# Patient Record
Sex: Female | Born: 1990 | Race: Black or African American | Hispanic: No | Marital: Married | Smoking: Current some day smoker
Health system: Southern US, Community
[De-identification: ages and names within clinical notes are randomized; demographics above are authoritative.]

## PROBLEM LIST (undated history)

## (undated) DIAGNOSIS — J3501 Chronic tonsillitis: Secondary | ICD-10-CM

## (undated) HISTORY — PX: NO PAST SURGERIES: SHX2092

---

## 2016-02-27 ENCOUNTER — Encounter: Payer: Self-pay | Admitting: Emergency Medicine

## 2016-02-27 ENCOUNTER — Emergency Department
Admission: EM | Admit: 2016-02-27 | Discharge: 2016-02-27 | Disposition: A | Discharge status: Home / Self Care | Payer: Self-pay | Attending: Student | Admitting: Student

## 2016-02-27 DIAGNOSIS — N390 Urinary tract infection, site not specified: Secondary | ICD-10-CM | POA: Insufficient documentation

## 2016-02-27 LAB — URINALYSIS COMPLETE WITH MICROSCOPIC (ARMC ONLY)
BILIRUBIN URINE: NEGATIVE
Glucose, UA: NEGATIVE mg/dL
Ketones, ur: NEGATIVE mg/dL
NITRITE: NEGATIVE
PH: 7 (ref 5.0–8.0)
Protein, ur: NEGATIVE mg/dL
SPECIFIC GRAVITY, URINE: 1.008 (ref 1.005–1.030)

## 2016-02-27 LAB — COMPREHENSIVE METABOLIC PANEL
ALBUMIN: 3.8 g/dL (ref 3.5–5.0)
ALK PHOS: 66 U/L (ref 38–126)
ALT: 17 U/L (ref 14–54)
ANION GAP: 7 (ref 5–15)
AST: 23 U/L (ref 15–41)
BILIRUBIN TOTAL: 0.6 mg/dL (ref 0.3–1.2)
BUN: 11 mg/dL (ref 6–20)
CHLORIDE: 108 mmol/L (ref 101–111)
CO2: 24 mmol/L (ref 22–32)
Calcium: 9.1 mg/dL (ref 8.9–10.3)
Creatinine, Ser: 0.83 mg/dL (ref 0.44–1.00)
GFR calc Af Amer: >60 mL/min (ref >60)
GFR calc non Af Amer: >60 mL/min (ref >60)
Glucose, Bld: 90 mg/dL (ref 65–99)
POTASSIUM: 4 mmol/L (ref 3.5–5.1)
SODIUM: 139 mmol/L (ref 135–145)
TOTAL PROTEIN: 7.8 g/dL (ref 6.5–8.1)

## 2016-02-27 LAB — CBC
HEMATOCRIT: 35.1 % (ref 35.0–47.0)
Hemoglobin: 11.8 g/dL — ABNORMAL LOW (ref 12.0–16.0)
MCH: 30.7 pg (ref 26.0–34.0)
MCHC: 33.5 g/dL (ref 32.0–36.0)
MCV: 91.5 fL (ref 80.0–100.0)
PLATELETS: 315 10*3/uL (ref 150–440)
RBC: 3.84 MIL/uL (ref 3.80–5.20)
RDW: 13.4 % (ref 11.5–14.5)
WBC: 8.3 10*3/uL (ref 3.6–11.0)

## 2016-02-27 LAB — LIPASE, BLOOD: Lipase: 15 U/L (ref 11–51)

## 2016-02-27 LAB — POCT PREGNANCY, URINE: Preg Test, Ur: NEGATIVE

## 2016-02-27 MED ORDER — SULFAMETHOXAZOLE-TRIMETHOPRIM 800-160 MG PO TABS
1.0000 | ORAL_TABLET | Freq: Once | ORAL | Status: AC
  Administered 2016-02-27: 1 via ORAL
  Filled 2016-02-27: qty 1

## 2016-02-27 MED ORDER — SULFAMETHOXAZOLE-TRIMETHOPRIM 800-160 MG PO TABS: 1.0000 | ORAL_TABLET | Freq: Two times a day (BID) | ORAL | Status: DC

## 2016-02-27 NOTE — Discharge Instructions (Signed)
Urinary Tract Infection Urinary tract infections (UTIs) can develop anywhere along your urinary tract. Your urinary tract is your body's drainage system for removing wastes and extra water. Your urinary tract includes two kidneys, two ureters, a bladder, and a urethra. Your kidneys are a pair of bean-shaped organs. Each kidney is about the size of your fist. They are located below your ribs, one on each side of your spine. CAUSES Infections are caused by microbes, which are microscopic organisms, including fungi, viruses, and bacteria. These organisms are so small that they can only be seen through a microscope. Bacteria are the microbes that most commonly cause UTIs. SYMPTOMS  Symptoms of UTIs may vary by age and gender of the patient and by the location of the infection. Symptoms in young women typically include a frequent and intense urge to urinate and a painful, burning feeling in the bladder or urethra during urination. Older women and men are more likely to be tired, shaky, and weak and have muscle aches and abdominal pain. A fever may mean the infection is in your kidneys. Other symptoms of a kidney infection include pain in your back or sides below the ribs, nausea, and vomiting. DIAGNOSIS To diagnose a UTI, your caregiver will ask you about your symptoms. Your caregiver will also ask you to provide a urine sample. The urine sample will be tested for bacteria and white blood cells. White blood cells are made by your body to help fight infection. TREATMENT  Typically, UTIs can be treated with medication. Because most UTIs are caused by a bacterial infection, they usually can be treated with the use of antibiotics. The choice of antibiotic and length of treatment depend on your symptoms and the type of bacteria causing your infection. HOME CARE INSTRUCTIONS  If you were prescribed antibiotics, take them exactly as your caregiver instructs you. Finish the medication even if you feel better after  you have only taken some of the medication.  Drink enough water and fluids to keep your urine clear or pale yellow.  Avoid caffeine, tea, and carbonated beverages. They tend to irritate your bladder.  Empty your bladder often. Avoid holding urine for long periods of time.  Empty your bladder before and after sexual intercourse.  After a bowel movement, women should cleanse from front to back. Use each tissue only once. SEEK MEDICAL CARE IF:   You have back pain.  You develop a fever.  Your symptoms do not begin to resolve within 3 days. SEEK IMMEDIATE MEDICAL CARE IF:   You have severe back pain or lower abdominal pain.  You develop chills.  You have nausea or vomiting.  You have continued burning or discomfort with urination. MAKE SURE YOU:   Understand these instructions.  Will watch your condition.  Will get help right away if you are not doing well or get worse.   This information is not intended to replace advice given to you by your health care provider. Make sure you discuss any questions you have with your health care provider.   Document Released: 06/23/2005 Document Revised: 06/04/2015 Document Reviewed: 10/22/2011 Elsevier Interactive Patient Education [employer]2016 [employer]vier [employer].   Take the prescription med as directed. Increase fluid intake and empty your bladder on schedule. [facility] needed or return to the ED as needed.

## 2016-02-27 NOTE — ED Provider Notes (Signed)
[facility] Emergency Department Provider Note ____________________________________________  Time seen: 1350  I have reviewed the triage vital signs and the nursing notes.  HISTORY  Chief Complaint  Back Pain  HPI Disney Ruggiero is a 25 y.o. female sensitivity ED for evaluation of pain to her left flank and lower pelvis for the last 2 days. She denies any injury, trauma, fall, or accident. She does have a history of some left sciatica was concerned that that was plan again. She does not note any referred pain beyond the waist or hip. She has noticed some dark urine over the last few days as well she denies any outright fevers or sweats, but has noticed some slight chills intermittently.She rates her overall discomfort at a 7/10 in triage  History reviewed. No pertinent past medical history.  There are no active problems to display for this patient.  History reviewed. No pertinent past surgical history.  Current Outpatient Rx  Name  Route  Sig  Dispense  Refill  . sulfamethoxazole-trimethoprim (BACTRIM DS,SEPTRA DS) 800-160 MG tablet   Oral   Take 1 tablet by mouth 2 (two) times daily.   14 tablet   0     Allergies Penicillins  History reviewed. No pertinent family history.  Social History Social History  Substance Use Topics  . Smoking status: Never Smoker   . Smokeless tobacco: None  . Alcohol Use: Yes    Review of Systems  Constitutional: Negative for fever. Reports chills.  Cardiovascular: Negative for chest pain. Respiratory: Negative for shortness of breath. Gastrointestinal: Negative for abdominal pain, vomiting and diarrhea. Reports left flank pain  Genitourinary: Negative for dysuria. Describes dark urine Musculoskeletal: Positive for back pain. Neurological: Negative for headaches, focal weakness or numbness. ____________________________________________  PHYSICAL EXAM:  VITAL SIGNS: ED Triage Vitals  Enc Vitals Group     BP  02/27/16 1108 129/68 mmHg     Pulse Rate 02/27/16 1108 85     Resp 02/27/16 1108 20     Temp 02/27/16 1108 97.6 F (36.4 C)     Temp Source 02/27/16 1108 Oral     SpO2 02/27/16 1108 100 %     Weight 02/27/16 1108 232 lb (105.235 kg)     Height 02/27/16 1108  (1.727 m)     Head Cir --      Peak Flow --      Pain Score 02/27/16 1109 7     Pain Loc --      Pain Edu? --      Excl. in GC? --    Constitutional: Alert and oriented. Well appearing and in no distress. Head: Normocephalic and atraumatic. Cardiovascular: Normal rate, regular rhythm.  Respiratory: Normal respiratory effort. No wheezes/rales/rhonchi. Gastrointestinal: Soft and nontender. No distention, rebound, guarding, or organomegaly. Mild left CVA tenderness.  Musculoskeletal: Nontender with normal range of motion in all extremities.  Neurologic:  Normal gait without ataxia. Normal speech and language. No gross focal neurologic deficits are appreciated. __________________________________________    LABS (pertinent positives/negatives) Labs Reviewed  CBC - Abnormal; Notable for the following:    Hemoglobin 11.8 (*)    All other components within normal limits  URINALYSIS COMPLETEWITH MICROSCOPIC ([facility] ONLY) - Abnormal; Notable for the following:    Color, Urine YELLOW (*)    APPearance HAZY (*)    Hgb urine dipstick 1+ (*)    Leukocytes, UA 2+ (*)    Bacteria, UA RARE (*)    Squamous Epithelial / LPF 6-30 (*)  All other components within normal limits  URINE CULTURE  LIPASE, BLOOD  COMPREHENSIVE METABOLIC PANEL  POC URINE PREG, ED  POCT PREGNANCY, URINE  ____________________________________________  PROCEDURES  Bactrim DS i PO ____________________________________________  INITIAL IMPRESSION / ASSESSMENT AND PLAN / ED COURSE  Presents to the ED with a urinary tract infection confirmed on urinalysis. She'll be discharged with a prescription for Bactrim to dose daily as directed. She should follow-up  with a primary care provider for ongoing symptom management. Return precautions are reviewed. A work note is provided suggesting limited and equal sit & stand during her shift tomorrow. ____________________________________________  FINAL CLINICAL IMPRESSION(S) / ED DIAGNOSES  Final diagnoses:  UTI (lower urinary tract infection)     Lissa HoardJenise V Bacon Donte Lenzo, PA-C 02/27/16 1533  Gayla DossEryka A Gayle, MD 02/27/16 1655

## 2016-02-27 NOTE — ED Notes (Signed)
Pt verbalized understanding of discharge instructions. NAD at this time. 

## 2016-02-27 NOTE — ED Notes (Signed)
Pt to ed with c/o lower back pain that radiates into left buttock area, hx of sciatica.  Pt also reports left lower abd pain that radiates to left flank area x 2 days.  Pt denies difficulty and denies pain with urination.  Pt denies vaginal d/c.  Denies v/d.

## 2016-02-27 NOTE — ED Notes (Signed)
States she developed lower back pain which radiates into left abd and left leg deneis any injury  Ambulates slowly d/t pain

## 2016-02-29 LAB — URINE CULTURE
Culture: >=100000 — AB
SPECIAL REQUESTS: NORMAL

## 2016-09-03 ENCOUNTER — Encounter: Payer: Self-pay | Admitting: Emergency Medicine

## 2016-09-03 ENCOUNTER — Emergency Department
Admission: EM | Admit: 2016-09-03 | Discharge: 2016-09-03 | Disposition: A | Discharge status: Home / Self Care | Payer: 59 | Attending: Emergency Medicine | Admitting: Emergency Medicine

## 2016-09-03 ENCOUNTER — Emergency Department: Payer: 59

## 2016-09-03 DIAGNOSIS — Z3A01 Less than 8 weeks gestation of pregnancy: Secondary | ICD-10-CM | POA: Insufficient documentation

## 2016-09-03 DIAGNOSIS — O4690 Antepartum hemorrhage, unspecified, unspecified trimester: Secondary | ICD-10-CM

## 2016-09-03 DIAGNOSIS — O3680X0 Pregnancy with inconclusive fetal viability, not applicable or unspecified: Secondary | ICD-10-CM

## 2016-09-03 DIAGNOSIS — O209 Hemorrhage in early pregnancy, unspecified: Secondary | ICD-10-CM | POA: Diagnosis not present

## 2016-09-03 LAB — CBC
HEMATOCRIT: 35.6 % (ref 35.0–47.0)
Hemoglobin: 12.2 g/dL (ref 12.0–16.0)
MCH: 32 pg (ref 26.0–34.0)
MCHC: 34.3 g/dL (ref 32.0–36.0)
MCV: 93.2 fL (ref 80.0–100.0)
PLATELETS: 240 10*3/uL (ref 150–440)
RBC: 3.82 MIL/uL (ref 3.80–5.20)
RDW: 13.9 % (ref 11.5–14.5)
WBC: 5.6 10*3/uL (ref 3.6–11.0)

## 2016-09-03 LAB — ABO/RH: ABO/RH(D): A NEG

## 2016-09-03 LAB — ANTIBODY SCREEN: ANTIBODY SCREEN: NEGATIVE

## 2016-09-03 LAB — HCG, QUANTITATIVE, PREGNANCY: hCG, Beta Chain, Quant, S: 129 m[IU]/mL — ABNORMAL HIGH (ref <5)

## 2016-09-03 MED ORDER — RHO D IMMUNE GLOBULIN 1500 UNIT/2ML IJ SOSY
300.0000 ug | PREFILLED_SYRINGE | Freq: Once | INTRAMUSCULAR | Status: AC
  Administered 2016-09-03: 300 ug via INTRAMUSCULAR
  Filled 2016-09-03: qty 2

## 2016-09-03 NOTE — ED Notes (Signed)
Pt comfortable and smiling. Denies pain and states it's "just discomfort.

## 2016-09-03 NOTE — ED Provider Notes (Signed)
[facility] Emergency Department Provider Note   ____________________________________________   First MD Initiated Contact with Patient 09/03/16 [phone]     (approximate)  I have reviewed the triage vital signs and the nursing notes.   HISTORY  Chief Complaint Vaginal Bleeding    HPI Sonya Cole is a 25 y.o. female 2 previous normal vaginal deliveries. Patient reports her last menstrual cycle was somewhat unclear, but believes it started around the end of October. She estimates she is about 6-[redacted] weeks pregnant, and had intercourse last night followed by some slight red vaginal bleeding, this morning having only needed one pad and now a slight purplish color.  Denies being in any significant discomfort or pain. Reports she's had slight vaginal bleeding early in both of her previous pregnancies. She has had to have a program in the past for A- blood type.  No fevers or chills. No other vaginal discharge. Denies any pelvic pain or cramps. She has obstetrician at [facility].    History reviewed. No pertinent past medical history.  There are no active problems to display for this patient.   History reviewed. No pertinent surgical history.  Prior to Admission medications   Medication Sig Start Date End Date Taking? Authorizing Provider  sulfamethoxazole-trimethoprim (BACTRIM DS,SEPTRA DS) 800-160 MG tablet Take 1 tablet by mouth 2 (two) times daily. 02/27/16   Lissa Hoard, PA-C  Patient currently not taking any medications, she is taking a prenatal vitamin only  Allergies Penicillins  No family history on file.  Social History Social History  Substance Use Topics  . Smoking status: Never Smoker  . Smokeless tobacco: Never Used  . Alcohol use Yes    Review of Systems Constitutional: No fever/chills Eyes: No visual changes. ENT: No sore throat. Cardiovascular: Denies chest pain. Respiratory: Denies shortness of  breath. Gastrointestinal: No abdominal pain.  No nausea, no vomiting.  No diarrhea.  No constipation. Genitourinary: Negative for dysuria. Musculoskeletal: Negative for back pain. Skin: Negative for rash. Neurological: Negative for headaches, focal weakness or numbness.  10-point ROS otherwise negative.  ____________________________________________   PHYSICAL EXAM:  VITAL SIGNS: ED Triage Vitals  Enc Vitals Group     BP 09/03/16 0849 (!) 149/88     Pulse Rate 09/03/16 0849 74     Resp 09/03/16 0849 18     Temp 09/03/16 0849 97.6 F (36.4 C)     Temp Source 09/03/16 0849 Oral     SpO2 09/03/16 0849 100 %     Weight 09/03/16 0847 215 lb (97.5 kg)     Height 09/03/16 0847 5\' 8"  (1.727 m)     Head Circumference --      Peak Flow --      Pain Score --      Pain Loc --      Pain Edu? --      Excl. in GC? --     Constitutional: Alert and oriented. Well appearing and in no acute distress.Patient and her husband both very pleasant. Eyes: Conjunctivae are normal. PERRL. EOMI. Head: Atraumatic. Nose: No congestion/rhinnorhea. Mouth/Throat: Mucous membranes are moist.   Neck: No stridor.   Cardiovascular: Normal rate, regular rhythm. Grossly normal heart sounds.  Good peripheral circulation. Respiratory: Normal respiratory effort.  No retractions. Lungs CTAB. Gastrointestinal: Soft and nontender. No distention.  Musculoskeletal: No lower extremity tenderness nor edema.   Neurologic:  Normal speech and language. No gross focal neurologic deficits are appreciated. Skin:  Skin is warm, dry  and intact. No rash noted. Psychiatric: Mood and affect are normal. Speech and behavior are normal.  ____________________________________________   LABS (all labs ordered are listed, but only abnormal results are displayed)  Labs Reviewed  HCG, QUANTITATIVE, PREGNANCY - Abnormal; Notable for the following:       Result Value   hCG, Beta Chain, Quant, S 129 (*)    All other components  within normal limits  CBC  ABO/RH  RHOGAM INJECTION  ANTIBODY SCREEN   ____________________________________________  EKG   ____________________________________________  RADIOLOGY  [place] Ob Comp Less 14 Wks  Result Date: 09/03/2016 CLINICAL DATA:  Six-seven weeks pregnant. Vaginal bleeding. Quantitative beta HCG today is 129. LMP 07/18/2016. By LMP patient is 6 weeks 5 days. EDC by LMP is 04/24/2017. EXAM: OBSTETRIC <14 WK US AND TRANSVAGINAL OB US TECHNIQUE: Both transabdominal and transvaginal ultrasound examinations were performed for complete evaluation of the gestation as well as the maternal uterus, adnexal regions, and pelvic cul-de-sac. Transvaginal technique was performed to assess early pregnancy. COMPARISON:  None. FINDINGS: Intrauterine gestational sac: None Yolk sac:  None Embryo:  None Cardiac Activity: None Heart Rate: Absent  bpm Subchorionic hemorrhage:  None visualized. Maternal uterus/adnexae: The ovaries have a normal appearance. Right corpus luteum cyst is present. Small amount of free pelvic fluid is present. IMPRESSION: 1.  Pregnancy of unknown location. 2. Considerations include completed spontaneous abortion, early intrauterine pregnancy or early ectopic pregnancy. 3. Serial quantitative beta HCG values and follow-up ultrasound are recommended as appropriate to document progression of and location of pregnancy. Ectopic pregnancy has not been excluded. Electronically Signed   By: Norva PavlovElizabeth  Brown M.D.   On: 09/03/2016 11:24   [place] Ob Transvaginal  Result Date: 09/03/2016 CLINICAL DATA:  Six-seven weeks pregnant. Vaginal bleeding. Quantitative beta HCG today is 129. LMP 07/18/2016. By LMP patient is 6 weeks 5 days. EDC by LMP is 04/24/2017. EXAM: OBSTETRIC <14 WK US AND TRANSVAGINAL OB US TECHNIQUE: Both transabdominal and transvaginal ultrasound examinations were performed for complete evaluation of the gestation as well as the maternal uterus, adnexal regions, and pelvic  cul-de-sac. Transvaginal technique was performed to assess early pregnancy. COMPARISON:  None. FINDINGS: Intrauterine gestational sac: None Yolk sac:  None Embryo:  None Cardiac Activity: None Heart Rate: Absent  bpm Subchorionic hemorrhage:  None visualized. Maternal uterus/adnexae: The ovaries have a normal appearance. Right corpus luteum cyst is present. Small amount of free pelvic fluid is present. IMPRESSION: 1.  Pregnancy of unknown location. 2. Considerations include completed spontaneous abortion, early intrauterine pregnancy or early ectopic pregnancy. 3. Serial quantitative beta HCG values and follow-up ultrasound are recommended as appropriate to document progression of and location of pregnancy. Ectopic pregnancy has not been excluded. Electronically Signed   By: Norva PavlovElizabeth  Brown M.D.   On: 09/03/2016 11:24    ____________________________________________   PROCEDURES  Procedure(s) performed: None  Procedures  Critical Care performed: No  ____________________________________________   INITIAL IMPRESSION / ASSESSMENT AND PLAN / ED COURSE  Pertinent labs & imaging results that were available during my care of the patient were reviewed by me and considered in my medical decision making (see chart for details).  Painless vaginal bleeding. Patient estimates about 6-[redacted] weeks pregnant. Hemodynamically stable. Plan check hCG, obtain a transvaginal ultrasound to further evaluate for cause of bleeding in early pregnancy, excluded ectopic, etc. No systemic symptoms. No infectious symptoms. Bleeding has improved and described as mild to minimal at this time.  ----------------------------------------- 12:08 PM on 09/03/2016 -----------------------------------------  Case care  ultrasound and hCG level discussed with Dr. Elesa MassedWard of gynecology. She advises follow-up next week in the office. I think this is agreeable, I discussed with the patient that we cannot be certain she doesn't have a  "ectopic or tubal pregnancy" that based on her improvement bleeding, no significant pain I suspect more likely possibly a threatened miscarriage, or potentially early bleeding in pregnancy though hCG is extremely low and does not match that of her 6-7 week last menstrual period though somewhat uncertain.  The patient and her husband are very agreeable with careful return precautions, and a careful plan for follow-up with Dr. Elesa MassedWard.  Rhogam given  Clinical Course      ____________________________________________   FINAL CLINICAL IMPRESSION(S) / ED DIAGNOSES  Final diagnoses:  Pregnancy of unknown anatomic location  Vaginal bleeding in pregnancy      NEW MEDICATIONS STARTED DURING THIS VISIT:  New Prescriptions   No medications on file     Note:  This document was prepared using Dragon voice recognition software and may include unintentional dictation errors.     Sharyn CreamerMark Minahil Quinlivan, MD 09/03/16 1210

## 2016-09-03 NOTE — ED Triage Notes (Signed)
Woke up with vag bleeding, [redacted] weeks pregnant, intercourse last pm.

## 2016-09-03 NOTE — Discharge Instructions (Signed)
Call and set up follow-up for next week with Dr. Leeroy Bockhelsea Ward of [facility]   Return to the emergency room if your bleeding worsens, you become weak and dizzy or lightheaded, you have an episode of passing out, develop severe bleeding such as more than 1 soaked pad per hour for more than 3 straight hours, develop abdominal or pelvic pain, fevers chills or other new concerns arise.

## 2016-09-03 NOTE — ED Notes (Signed)
Pt verbalized understanding of discharge instructions. NAD at this time. 

## 2016-09-04 LAB — RHOGAM INJECTION: Unit division: 0

## 2016-11-06 ENCOUNTER — Encounter: Payer: Self-pay | Admitting: Medical Oncology

## 2016-11-06 ENCOUNTER — Emergency Department
Admission: EM | Admit: 2016-11-06 | Discharge: 2016-11-06 | Disposition: A | Discharge status: Home / Self Care | Payer: 59 | Attending: Emergency Medicine | Admitting: Emergency Medicine

## 2016-11-06 DIAGNOSIS — J029 Acute pharyngitis, unspecified: Secondary | ICD-10-CM | POA: Diagnosis present

## 2016-11-06 DIAGNOSIS — J020 Streptococcal pharyngitis: Secondary | ICD-10-CM | POA: Insufficient documentation

## 2016-11-06 DIAGNOSIS — J039 Acute tonsillitis, unspecified: Secondary | ICD-10-CM

## 2016-11-06 MED ORDER — AZITHROMYCIN 200 MG/5ML PO SUSR
250.0000 mg | Freq: Every day | ORAL | 0 refills | Status: AC
Start: 2016-11-06 — End: 2016-11-10

## 2016-11-06 MED ORDER — DEXAMETHASONE SODIUM PHOSPHATE 10 MG/ML IJ SOLN
10.0000 mg | Freq: Once | INTRAMUSCULAR | Status: AC
  Administered 2016-11-06: 10 mg via INTRAMUSCULAR
  Filled 2016-11-06: qty 1

## 2016-11-06 MED ORDER — DEXAMETHASONE 4 MG PO TABS: 4.0000 mg | ORAL_TABLET | Freq: Two times a day (BID) | ORAL | 0 refills | Status: AC

## 2016-11-06 MED ORDER — AZITHROMYCIN 200 MG/5ML PO SUSR
500.0000 mg | Freq: Once | ORAL | Status: AC
  Administered 2016-11-06: 500 mg via ORAL
  Filled 2016-11-06: qty 1

## 2016-11-06 NOTE — ED Notes (Signed)
Pt verbalized understanding of discharge instructions. NAD at this time. 

## 2016-11-06 NOTE — ED Triage Notes (Signed)
Sore throat and fever that began yesterday.

## 2016-11-06 NOTE — ED Provider Notes (Signed)
[facility] Emergency Department Provider Note ____________________________________________  Time seen: 1331  I have reviewed the triage vital signs and the nursing notes.  HISTORY  Chief Complaint  Sore Throat and Fever  HPI Sonya Cole is a 26 y.o. female resistance to the ED for evaluation of sudden onset of sore throat yesterday. She describes swollen lymph nodes and difficulty swallowing. She also reports intermittent fevers. Patient gives a history of strep tonsillitis in the past. She's been taking Tylenol for symptoms but has not been difficult to swallow the pills without discomfort.She denies any difficulty breathing, or controlling oral secretions.  History reviewed. No pertinent past medical history.  There are no active problems to display for this patient.  History reviewed. No pertinent surgical history.  Prior to Admission medications   Medication Sig Start Date End Date Taking? Authorizing Provider  azithromycin (ZITHROMAX) 200 MG/5ML suspension Take 6.3 mLs (250 mg total) by mouth daily. 11/06/16 11/10/16  Moana Munford V Bacon Avianah Pellman, PA-C  dexamethasone (DECADRON) 4 MG tablet Take 1 tablet (4 mg total) by mouth 2 (two) times daily with a meal. 11/06/16 11/11/16  Lizzie Cokley V Bacon Shondrea Steinert, PA-C  sulfamethoxazole-trimethoprim (BACTRIM DS,SEPTRA DS) 800-160 MG tablet Take 1 tablet by mouth 2 (two) times daily. 02/27/16   Genetta Fiero V Bacon Stevon Gough, PA-C    Allergies Penicillins  No family history on file.  Social History Social History  Substance Use Topics  . Smoking status: Never Smoker  . Smokeless tobacco: Never Used  . Alcohol use Yes    Review of Systems  Constitutional: Positive for fever. Eyes: Negative for visual changes. ENT: Positive for sore throat. Cardiovascular: Negative for chest pain. Respiratory: Negative for shortness of breath. Gastrointestinal: Negative for abdominal pain, vomiting and diarrhea. Skin: Negative for  rash. Neurological: Negative for headaches, focal weakness or numbness. ____________________________________________  PHYSICAL EXAM:  VITAL SIGNS: ED Triage Vitals  Enc Vitals Group     BP 11/06/16 1144 126/73     Pulse Rate 11/06/16 1144 94     Resp 11/06/16 1144 18     Temp 11/06/16 1144 98.4 F (36.9 C)     Temp Source 11/06/16 1144 Oral     SpO2 11/06/16 1144 100 %     Weight 11/06/16 1140 190 lb (86.2 kg)     Height 11/06/16 1140 5\' 9"  (1.753 m)     Head Circumference --      Peak Flow --      Pain Score 11/06/16 1140 8     Pain Loc --      Pain Edu? --      Excl. in GC? --    Constitutional: Alert and oriented. Well appearing and in no distress. Head: Normocephalic and atraumatic. Eyes: Conjunctivae are normal. PERRL. Normal extraocular movements Ears: Canals clear. TMs intact bilaterally. Nose: No congestion/rhinorrhea/epistaxis. Mouth/Throat: Mucous membranes are moist. Uvula is midline and tonsils are enlarged, injected, erythematous, with purulent exudate noted. Neck: Supple. No thyromegaly. Hematological/Lymphatic/Immunological: No cervical lymphadenopathy. Cardiovascular: Normal rate, regular rhythm. Normal distal pulses. Respiratory: Normal respiratory effort. No wheezes/rales/rhonchi. Gastrointestinal: Soft and nontender. No distention. ____________________________________________  PROCEDURES  Decadron 10 mg IM Azithromycin suspension 500 mg PO ____________________________________________  INITIAL IMPRESSION / ASSESSMENT AND PLAN / ED COURSE  Patient with clinical presentation consistent with acute tonsillitis. She is discharged at this time with prescription for azithromycin suspension and Decadron tablets to dose as directed. She will follow up with [facility] or return to the ED for acutely worsening symptoms. ____________________________________________  FINAL CLINICAL IMPRESSION(S) / ED DIAGNOSES  Final diagnoses:  Strep pharyngitis  Tonsillitis       Lissa HoardJenise V Bacon Zenna Traister, PA-C 11/06/16 1411    Myrna Blazeravid Matthew Schaevitz, MD 11/06/16 [phone]

## 2016-11-06 NOTE — Discharge Instructions (Signed)
Take the prescription antibiotic as directed, starting tomorrow. Take the steroid for swelling as directed. Rinse with warm salty water, and take Tylenol for pain relief. Change your toothbrush head after 24-hours on you antibiotic. Return to the ED for increased swelling, or difficulty swallowing your spit, or difficulty breathing.

## 2016-11-13 ENCOUNTER — Emergency Department
Admission: EM | Admit: 2016-11-13 | Discharge: 2016-11-13 | Disposition: A | Discharge status: Home / Self Care | Payer: 59 | Attending: Emergency Medicine | Admitting: Emergency Medicine

## 2016-11-13 ENCOUNTER — Emergency Department: Payer: 59

## 2016-11-13 ENCOUNTER — Encounter: Payer: Self-pay | Admitting: Emergency Medicine

## 2016-11-13 DIAGNOSIS — J029 Acute pharyngitis, unspecified: Secondary | ICD-10-CM | POA: Diagnosis present

## 2016-11-13 DIAGNOSIS — J36 Peritonsillar abscess: Secondary | ICD-10-CM | POA: Diagnosis not present

## 2016-11-13 LAB — BASIC METABOLIC PANEL
Anion gap: 5 (ref 5–15)
BUN: 26 mg/dL — AB (ref 6–20)
CALCIUM: 8.5 mg/dL — AB (ref 8.9–10.3)
CO2: 31 mmol/L (ref 22–32)
CREATININE: 1.01 mg/dL — AB (ref 0.44–1.00)
Chloride: 103 mmol/L (ref 101–111)
GFR calc Af Amer: >60 mL/min (ref >60)
Glucose, Bld: 86 mg/dL (ref 65–99)
POTASSIUM: 3.7 mmol/L (ref 3.5–5.1)
SODIUM: 139 mmol/L (ref 135–145)

## 2016-11-13 LAB — CBC WITH DIFFERENTIAL/PLATELET
Basophils Absolute: 0 10*3/uL (ref 0–0.1)
Basophils Relative: 0 %
EOS ABS: 0.1 10*3/uL (ref 0–0.7)
EOS PCT: 1 %
HCT: 35.8 % (ref 35.0–47.0)
Hemoglobin: 11.8 g/dL — ABNORMAL LOW (ref 12.0–16.0)
LYMPHS ABS: 3.1 10*3/uL (ref 1.0–3.6)
Lymphocytes Relative: 27 %
MCH: 31.4 pg (ref 26.0–34.0)
MCHC: 33.1 g/dL (ref 32.0–36.0)
MCV: 94.8 fL (ref 80.0–100.0)
Monocytes Absolute: 0.9 10*3/uL (ref 0.2–0.9)
Monocytes Relative: 8 %
Neutro Abs: 7.3 10*3/uL — ABNORMAL HIGH (ref 1.4–6.5)
Neutrophils Relative %: 64 %
PLATELETS: 284 10*3/uL (ref 150–440)
RBC: 3.77 MIL/uL — AB (ref 3.80–5.20)
RDW: 13.6 % (ref 11.5–14.5)
WBC: 11.5 10*3/uL — AB (ref 3.6–11.0)

## 2016-11-13 LAB — POCT PREGNANCY, URINE: PREG TEST UR: NEGATIVE

## 2016-11-13 LAB — MONONUCLEOSIS SCREEN: MONO SCREEN: NEGATIVE

## 2016-11-13 MED ORDER — CLINDAMYCIN PHOSPHATE 600 MG/50ML IV SOLN
600.0000 mg | Freq: Once | INTRAVENOUS | Status: AC
  Administered 2016-11-13: 600 mg via INTRAVENOUS
  Filled 2016-11-13: qty 50

## 2016-11-13 MED ORDER — IOPAMIDOL (ISOVUE-300) INJECTION 61%
75.0000 mL | Freq: Once | INTRAVENOUS | Status: AC | PRN
  Administered 2016-11-13: 75 mL via INTRAVENOUS
  Filled 2016-11-13: qty 75

## 2016-11-13 MED ORDER — DEXAMETHASONE SODIUM PHOSPHATE 10 MG/ML IJ SOLN
10.0000 mg | Freq: Once | INTRAMUSCULAR | Status: AC
  Administered 2016-11-13: 10 mg via INTRAVENOUS
  Filled 2016-11-13: qty 1

## 2016-11-13 MED ORDER — CLINDAMYCIN HCL 150 MG PO CAPS: ORAL_CAPSULE | ORAL | 0 refills | Status: DC

## 2016-11-13 MED ORDER — SODIUM CHLORIDE 0.9 % IV BOLUS (SEPSIS)
1000.0000 mL | Freq: Once | INTRAVENOUS | Status: AC
  Administered 2016-11-13: 1000 mL via INTRAVENOUS

## 2016-11-13 MED ORDER — PREDNISONE 10 MG PO TABS: ORAL_TABLET | ORAL | 0 refills | Status: DC

## 2016-11-13 NOTE — ED Notes (Signed)
Patient given water to drink. Patient unable to tolerate PO fluid at this time. When pt tried to swallow water and got choked and started coughing.  Rhonda PA-C notified.

## 2016-11-13 NOTE — ED Provider Notes (Signed)
[facility] Emergency Department Provider Note   ____________________________________________   First MD Initiated Contact with Patient 11/13/16 [phone]     (approximate)  I have reviewed the triage vital signs and the nursing notes.   HISTORY  Chief Complaint Sore Throat   HPI Sonya Cole is a 26 y.o. female still complaining of sore throat. Patient states that she was seen earlier in the week in the emergency room for sore throat. She completed all the Zithromax as directed and states that her throat got slightly better and then began getting worse.Smiling when she woke she felt much more swollen but has continued to swallow her own saliva. Patient drove herself to the emergency room today. Rates her pain as an 8/10.   History reviewed. No pertinent past medical history.  There are no active problems to display for this patient.   History reviewed. No pertinent surgical history.  Prior to Admission medications   Medication Sig Start Date End Date Taking? Authorizing Provider  clindamycin (CLEOCIN) 150 MG capsule Take 2 capsules every 6 hours. 11/13/16   Tommi Rumps, PA-C  predniSONE (DELTASONE) 10 MG tablet Take 6 tablets  today, on day 2 take 5 tablets, day 3 take 4 tablets, day 4 take 3 tablets, day 5 take  2 tablets and 1 tablet the last day 11/13/16   Tommi Rumps, PA-C    Allergies Penicillins  No family history on file.  Social History Social History  Substance Use Topics  . Smoking status: Never Smoker  . Smokeless tobacco: Never Used  . Alcohol use No    Review of Systems Constitutional: Subjective fever/chills Eyes: No visual changes. ENT: Positive sore throat. Cardiovascular: Denies chest pain. Respiratory: Denies shortness of breath. Gastrointestinal: No abdominal pain.  No nausea, no vomiting.   Musculoskeletal: Negative for back pain. Skin: Negative for rash. Neurological: Negative for headaches, focal weakness or  numbness. Allergic:  Penicillin 10-point ROS otherwise negative.  ____________________________________________   PHYSICAL EXAM:  VITAL SIGNS: ED Triage Vitals  Enc Vitals Group     BP 11/13/16 0608 107/61     Pulse Rate 11/13/16 0608 65     Resp 11/13/16 0608 16     Temp 11/13/16 0608 98.7 F (37.1 C)     Temp Source 11/13/16 0608 Oral     SpO2 11/13/16 0608 100 %     Weight 11/13/16 0611 190 lb (86.2 kg)     Height 11/13/16 0611 5\' 9"  (1.753 m)     Head Circumference --      Peak Flow --      Pain Score 11/13/16 0611 8     Pain Loc --      Pain Edu? --      Excl. in GC? --     Constitutional: Alert and oriented. Well appearing and in no acute distress. Eyes: Conjunctivae are normal. PERRL. EOMI. Head: Atraumatic. Nose: No congestion/rhinnorhea. Mouth/Throat: Mucous membranes are moist.  Oropharynx erythematous without exudate. Uvula is midline. Tonsils are enlarged bilaterally. Patient has muffled voice. Patient is able to swallow saliva and drink some fluids without any difficulty. Neck: No stridor.   Hematological/Lymphatic/Immunilogical: Tender bilateral cervical lymphadenopathy. Cardiovascular: Normal rate, regular rhythm. Grossly normal heart sounds.  Good peripheral circulation. Respiratory: Normal respiratory effort.  No retractions. Lungs CTAB. Gastrointestinal: Soft and nontender. No distention. Musculoskeletal: Moves upper and lower extremities with a difficulty. Patient's gait was noted to be normal. Neurologic:  Muffled speech  and normal language.  No gross focal neurologic deficits are appreciated. No gait instability. Skin:  Skin is warm, dry and intact. No rash noted. Psychiatric: Mood and affect are normal. Speech and behavior are normal.  ____________________________________________   LABS (all labs ordered are listed, but only abnormal results are displayed)  Labs Reviewed  CBC WITH DIFFERENTIAL/PLATELET - Abnormal; Notable for the following:        Result Value   WBC 11.5 (*)    RBC 3.77 (*)    Hemoglobin 11.8 (*)    Neutro Abs 7.3 (*)    All other components within normal limits  BASIC METABOLIC PANEL - Abnormal; Notable for the following:    BUN 26 (*)    Creatinine, Ser 1.01 (*)    Calcium 8.5 (*)    All other components within normal limits  MONONUCLEOSIS SCREEN  POC URINE PREG, ED  POCT PREGNANCY, URINE    RADIOLOGY CT soft tissue neck with contrast radiologist: IMPRESSION:  1. Tonsillitis with 2 left and 1 right peritonsillar abscess. The  largest abscess is left-sided and measures 11 mm.  2. Pharyngitis with left predominant pharyngeal edema. No epiglottic  thickening or laryngeal stenosis.  3. Cervical adenitis.     ____________________________________________   PROCEDURES  Procedure(s) performed: None  Procedures  Critical Care performed: No  ____________________________________________   INITIAL IMPRESSION / ASSESSMENT AND PLAN / ED COURSE  Pertinent labs & imaging results that were available during my care of the patient were reviewed by me and considered in my medical decision making (see chart for details).  Patient was made aware of radiology findings. Patient's medical findings was discussed with Dr. Elenore RotaJuengel who is on call for ENT.  Dr. Elenore RotaJuengel advised continued clindamycin and a 6 day taper of prednisone. Patient is encouraged to drink lots of fluids to stay hydrated. Patient was able to drink fluids and also eat some applesauce while in the emergency room. She will follow up with ENT as advised the week of the 26th if any continued problems. She is also advised to return to the emergency room if she is unable to swallow fluids or her secretions. Prior to discharge patient states that after receiving the antibiotics and the Decadron 10 mg IV she feels that her throat has improved.      ____________________________________________   FINAL CLINICAL IMPRESSION(S) / ED DIAGNOSES  Final  diagnoses:  Peritonsillar abscess      NEW MEDICATIONS STARTED DURING THIS VISIT:  Discharge Medication List as of 11/13/2016  1:03 PM    START taking these medications   Details  clindamycin (CLEOCIN) 150 MG capsule Take 2 capsules every 6 hours., Print    predniSONE (DELTASONE) 10 MG tablet Take 6 tablets  today, on day 2 take 5 tablets, day 3 take 4 tablets, day 4 take 3 tablets, day 5 take  2 tablets and 1 tablet the last day, Print         Note:  This document was prepared using Dragon voice recognition software and may include unintentional dictation errors.    Tommi RumpsRhonda L Summers, PA-C 11/13/16 1543    Nita Sicklearolina Veronese, MD 11/13/16 [id number]

## 2016-11-13 NOTE — Discharge Instructions (Signed)
Follow up with Dr. Elenore RotaJuengel week of February 26. Call and make an appointment at his office for follow-up and recheck. Return to the emergency room this weekend if unable to swallow saliva or drink fluids. Tylenol as needed for pain or fever. Begin taking prednisone taper as directed and clindamycin 2 capsules 4 times a day. Drink liquids to stay hydrated.

## 2016-11-13 NOTE — ED Notes (Signed)
Patient states that she had strep throat last week, she finished her antibiotics on Friday but now the left side of her throat is hurting, patient also reports pain in her neck and upper back. Pt is unsure if she has had fever.

## 2016-11-13 NOTE — ED Notes (Signed)

## 2016-11-13 NOTE — ED Triage Notes (Signed)
Pt states that she was here on 2/10 and diagnosed with strep throat at that time. Pt states that she has finished her antibiotics yesterday. Pt states that she is still having throat pain at this time. Pt is ambulatory to triage with NAD noted at this time.

## 2016-12-27 ENCOUNTER — Encounter: Payer: Self-pay | Admitting: *Deleted

## 2016-12-27 NOTE — Discharge Instructions (Signed)
T & A INSTRUCTION SHEET - [facility] °[facility] ° °CREIGHTON VAUGHT, MD °PAUL H. JUENGEL, MD  °P. [facility]T °CHAPMAN MCQUEEN, MD ° °[address] [place], [place] [address] TEL. [phone] °[address] [place] [address] [phone] ° °INFORMATION SHEET FOR A TONSILLECTOMY AND ADENDOIDECTOMY ° °About Your Tonsils and Adenoids ° The tonsils and adenoids are normal body tissues that are part of our immune system.  They normally help to protect us against diseases that may enter our mouth and nose.  However, sometimes the tonsils and/or adenoids become too large and obstruct our breathing, especially at night. °  ° If either of these things happen it helps to remove the tonsils and adenoids in order to become healthier. The operation to remove the tonsils and adenoids is called a tonsillectomy and adenoidectomy. ° °The Location of Your Tonsils and Adenoids ° The tonsils are located in the back of the throat on both side and sit in a cradle of muscles. The adenoids are located in the roof of the mouth, behind the nose, and closely associated with the opening of the Eustachian tube to the ear. ° °Surgery on Tonsils and Adenoids ° A tonsillectomy and adenoidectomy is a short operation which takes about thirty minutes.  This includes being put to sleep and being awakened.  Tonsillectomies and adenoidectomies are performed at [facility] and may require observation period in the recovery room prior to going home. ° °Following the Operation for a Tonsillectomy ° A cautery machine is used to control bleeding.  Bleeding from a tonsillectomy and adenoidectomy is minimal and postoperatively the risk of bleeding is approximately four percent, although this rarely life threatening. ° ° ° °After your tonsillectomy and adenoidectomy post-op care at home: ° °1. Our patients are able to go home the same day.  You may be given prescriptions for pain  medications and antibiotics, if indicated. °2. It is extremely important to remember that fluid intake is of utmost importance after a tonsillectomy.  The amount that you drink must be maintained in the postoperative period.  A good indication of whether a child is getting enough fluid is whether his/her urine output is constant.  As long as children are urinating or wetting their diaper every 6 - 8 hours this is usually enough fluid intake.   °3. Although rare, this is a risk of some bleeding in the first ten days after surgery.  This is usually occurs between day five and nine postoperatively.  This risk of bleeding is approximately four percent.  If you or your child should have any bleeding you should remain calm and notify our office or go directly to the Emergency Room at [facility] where they will contact us. Our doctors are available seven days a week for notification.  We recommend sitting up quietly in a chair, place an ice pack on the front of the neck and spitting out the blood gently until we are able to contact you.  Adults should gargle gently with ice water and this may help stop the bleeding.  If the bleeding does not stop after a short time, i.e. 10 to 15 minutes, or seems to be increasing again, please contact us or go to the hospital.   °4. It is common for the pain to be worse at 5 - 7 days postoperatively.  This occurs because the “scab” is peeling off and the mucous membrane (skin of   the throat) is growing back where the tonsils were.   °5. It is common for a low-grade fever, less than 102, during the first week after a tonsillectomy and adenoidectomy.  It is usually due to not drinking enough liquids, and we suggest your use liquid Tylenol or the pain medicine with Tylenol prescribed in order to keep your temperature below 102.  Please follow the directions on the back of the bottle. °6. Do not take aspirin or any products that contain aspirin such as Bufferin, Anacin,  Ecotrin, aspirin gum, Goodies, BC headache powders, etc., after a T&A because it can promote bleeding.  Please check with our office before administering any other medication that may been prescribed by other doctors during the two week post-operative period. °7. If you happen to look in the mirror or into your child’s mouth you will see white/gray patches on the back of the throat.  This is what a scab looks like in the mouth and is normal after having a T&A.  It will disappear once the tonsil area heals completely. However, it may cause a noticeable odor, and this too will disappear with time.     °8. You or your child may experience ear pain after having a T&A.  This is called referred pain and comes from the throat, but it is felt in the ears.  Ear pain is quite common and expected.  It will usually go away after ten days.  There is usually nothing wrong with the ears, and it is primarily due to the healing area stimulating the nerve to the ear that runs along the side of the throat.  Use either the prescribed pain medicine or Tylenol as needed.  °9. The throat tissues after a tonsillectomy are obviously sensitive.  Smoking around children who have had a tonsillectomy significantly increases the risk of bleeding.  DO NOT SMOKE!  ° °General Anesthesia, Adult, Care After °These instructions provide you with information about caring for yourself after your procedure. Your health care provider may also give you more specific instructions. Your treatment has been planned according to current medical practices, but problems sometimes occur. Call your health care provider if you have any problems or questions after your procedure. °What can I expect after the procedure? °After the procedure, it is common to have: °· Vomiting. °· A sore throat. °· Mental slowness. °It is common to feel: °· Nauseous. °· Cold or shivery. °· Sleepy. °· Tired. °· Sore or achy, even in parts of your body where you did not have  surgery. °Follow these instructions at home: °For at least 24 hours after the procedure:  °· Do not: °¨ Participate in activities where you could fall or become injured. °¨ Drive. °¨ Use heavy machinery. °¨ Drink alcohol. °¨ Take sleeping pills or medicines that cause drowsiness. °¨ Make important decisions or sign legal documents. °¨ Take care of children on your own. °· Rest. °Eating and drinking  °· If you vomit, drink water, juice, or soup when you can drink without vomiting. °· Drink enough fluid to keep your urine clear or pale yellow. °· Make sure you have little or no nausea before eating solid foods. °· Follow the diet recommended by your health care provider. °General instructions  °· Have a responsible adult stay with you until you are awake and alert. °· Return to your normal activities as told by your health care provider. Ask your health care provider what activities are safe for you. °· Take over-the-counter   and prescription medicines only as told by your health care provider. °· If you smoke, do not smoke without supervision. °· Keep all follow-up visits as told by your health care provider. This is important. °Contact a health care provider if: °· You continue to have nausea or vomiting at home, and medicines are not helpful. °· You cannot drink fluids or start eating again. °· You cannot urinate after 8-12 hours. °· You develop a skin rash. °· You have fever. °· You have increasing redness at the site of your procedure. °Get help right away if: °· You have difficulty breathing. °· You have chest pain. °· You have unexpected bleeding. °· You feel that you are having a life-threatening or urgent problem. °This information is not intended to replace advice given to you by your health care provider. Make sure you discuss any questions you have with your health care provider. °Document Released: 12/20/2000 Document Revised: 02/16/2016 Document Reviewed: 08/28/2015 °Elsevier Interactive Patient Education  © 2017 [employer] ° °

## 2016-12-30 ENCOUNTER — Encounter
Admission: RE | Disposition: A | Discharge status: Home / Self Care | Payer: Self-pay | Source: Ambulatory Visit | Attending: Otolaryngology

## 2016-12-30 ENCOUNTER — Ambulatory Visit: Payer: 59 | Admitting: Anesthesiology

## 2016-12-30 ENCOUNTER — Ambulatory Visit
Admission: RE | Admit: 2016-12-30 | Discharge: 2016-12-30 | Disposition: A | Discharge status: Home / Self Care | Payer: 59 | Source: Ambulatory Visit | Attending: Otolaryngology | Admitting: Otolaryngology

## 2016-12-30 DIAGNOSIS — F17200 Nicotine dependence, unspecified, uncomplicated: Secondary | ICD-10-CM | POA: Diagnosis not present

## 2016-12-30 DIAGNOSIS — J3501 Chronic tonsillitis: Secondary | ICD-10-CM | POA: Diagnosis not present

## 2016-12-30 HISTORY — PX: TONSILLECTOMY: SHX5217

## 2016-12-30 HISTORY — DX: Chronic tonsillitis: J35.01

## 2016-12-30 SURGERY — TONSILLECTOMY
Anesthesia: General | Wound class: Clean Contaminated

## 2016-12-30 MED ORDER — HYDRALAZINE HCL 20 MG/ML IJ SOLN
5.0000 mg | INTRAMUSCULAR | Status: DC | PRN
  Administered 2016-12-30: 5 mg via INTRAVENOUS

## 2016-12-30 MED ORDER — LACTATED RINGERS IV SOLN
INTRAVENOUS | Status: DC
  Administered 2016-12-30: 09:00:00 via INTRAVENOUS

## 2016-12-30 MED ORDER — MIDAZOLAM HCL 5 MG/5ML IJ SOLN
INTRAMUSCULAR | Status: DC | PRN
  Administered 2016-12-30: 2 mg via INTRAVENOUS

## 2016-12-30 MED ORDER — SUCCINYLCHOLINE CHLORIDE 20 MG/ML IJ SOLN
INTRAMUSCULAR | Status: DC | PRN
  Administered 2016-12-30: 100 mg via INTRAVENOUS

## 2016-12-30 MED ORDER — ONDANSETRON HCL 4 MG/2ML IJ SOLN: 4.0000 mg | Freq: Once | INTRAMUSCULAR | Status: DC | PRN

## 2016-12-30 MED ORDER — LIDOCAINE HCL 4 % MT SOLN
OROMUCOSAL | Status: DC | PRN
  Administered 2016-12-30: 4 mL via TOPICAL

## 2016-12-30 MED ORDER — OXYCODONE HCL 5 MG/5ML PO SOLN
5.0000 mg | Freq: Once | ORAL | Status: AC | PRN
  Administered 2016-12-30: 5 mg via ORAL

## 2016-12-30 MED ORDER — FENTANYL CITRATE (PF) 100 MCG/2ML IJ SOLN: 25.0000 ug | INTRAMUSCULAR | Status: DC | PRN

## 2016-12-30 MED ORDER — LIDOCAINE HCL (CARDIAC) 20 MG/ML IV SOLN
INTRAVENOUS | Status: DC | PRN
  Administered 2016-12-30: 40 mg via INTRAVENOUS

## 2016-12-30 MED ORDER — DEXAMETHASONE SODIUM PHOSPHATE 4 MG/ML IJ SOLN
INTRAMUSCULAR | Status: DC | PRN
  Administered 2016-12-30: 10 mg via INTRAVENOUS

## 2016-12-30 MED ORDER — ACETAMINOPHEN 10 MG/ML IV SOLN
1000.0000 mg | Freq: Once | INTRAVENOUS | Status: AC
  Administered 2016-12-30: 1000 mg via INTRAVENOUS

## 2016-12-30 MED ORDER — ONDANSETRON HCL 4 MG/2ML IJ SOLN
INTRAMUSCULAR | Status: DC | PRN
  Administered 2016-12-30: 4 mg via INTRAVENOUS

## 2016-12-30 MED ORDER — GLYCOPYRROLATE 0.2 MG/ML IJ SOLN
INTRAMUSCULAR | Status: DC | PRN
  Administered 2016-12-30: .1 mg via INTRAVENOUS

## 2016-12-30 MED ORDER — FENTANYL CITRATE (PF) 100 MCG/2ML IJ SOLN
INTRAMUSCULAR | Status: DC | PRN
  Administered 2016-12-30 (×2): 25 ug via INTRAVENOUS
  Administered 2016-12-30: 100 ug via INTRAVENOUS
  Administered 2016-12-30: 25 ug via INTRAVENOUS

## 2016-12-30 MED ORDER — PROPOFOL 10 MG/ML IV BOLUS
INTRAVENOUS | Status: DC | PRN
  Administered 2016-12-30: 150 mg via INTRAVENOUS

## 2016-12-30 MED ORDER — IBUPROFEN 100 MG/5ML PO SUSP: 600.0000 mg | Freq: Once | ORAL | Status: DC | PRN

## 2016-12-30 MED ORDER — OXYCODONE HCL 5 MG PO TABS: 5.0000 mg | ORAL_TABLET | Freq: Once | ORAL | Status: AC | PRN

## 2016-12-30 MED ORDER — SCOPOLAMINE 1 MG/3DAYS TD PT72
1.0000 | MEDICATED_PATCH | TRANSDERMAL | Status: DC
  Administered 2016-12-30: 1.5 mg via TRANSDERMAL

## 2016-12-30 SURGICAL SUPPLY — 12 items
CANISTER SUCT 1200ML W/VALVE (MISCELLANEOUS) ×3 IMPLANT
ELECT CAUTERY BLADE TIP 2.5 (TIP) ×3
ELECTRODE CAUTERY BLDE TIP 2.5 (TIP) ×1 IMPLANT
GLOVE PI ULTRA LF STRL 7.5 (GLOVE) ×1 IMPLANT
GLOVE PI ULTRA NON LATEX 7.5 (GLOVE) ×2
KIT ROOM TURNOVER OR (KITS) ×3 IMPLANT
PACK TONSIL/ADENOIDS (PACKS) ×3 IMPLANT
PAD GROUND ADULT SPLIT (MISCELLANEOUS) ×3 IMPLANT
PENCIL ELECTRO HAND CTR (MISCELLANEOUS) ×3 IMPLANT
SOL ANTI-FOG 6CC FOG-OUT (MISCELLANEOUS) ×1 IMPLANT
SOL FOG-OUT ANTI-FOG 6CC (MISCELLANEOUS) ×2
STRAP BODY AND KNEE 60X3 (MISCELLANEOUS) ×3 IMPLANT

## 2016-12-30 NOTE — Anesthesia Procedure Notes (Signed)
Procedure Name: Intubation Date/Time: 12/30/2016 8:59 AM Performed by: Jimmy Picket Pre-anesthesia Checklist: Patient identified, Emergency Drugs available, Suction available, Patient being monitored and Timeout performed Patient Re-evaluated:Patient Re-evaluated prior to inductionOxygen Delivery Method: Circle system utilized Preoxygenation: Pre-oxygenation with 100% oxygen Intubation Type: IV induction Ventilation: Mask ventilation without difficulty Laryngoscope Size: Miller and 2 Grade View: Grade I Tube type: Oral Rae Tube size: 7.0 mm Number of attempts: 1 Placement Confirmation: ETT inserted through vocal cords under direct vision,  positive ETCO2 and breath sounds checked- equal and bilateral Tube secured with: Tape Dental Injury: Teeth and Oropharynx as per pre-operative assessment

## 2016-12-30 NOTE — Op Note (Signed)
12/30/2016  9:22 AM    Sonya Cole  [id number]    Pre-Op Dx:  Chronic tonsillitis  Post-op Dx: Chronic tonsillitis  Proc: Tonsillectomy   Surg:  Sonya Cole  Anes:  GOT  EBL:  50 mL  Comp:  None  Findings:  Large tonsils that were densely adherent to the muscle bed was scar tissue in the peritonsillar area. Evidence for previous abscesses on both sides worse on the left  Procedure: The patient was given general anesthesia by oral endotracheal intubation. A Davis mouth gag was used to visualize the oropharynx. The soft palate was retracted and the adenoids were not enlarged. The tonsils were quite large. The left tonsil was grasped pulled medially and the anterior pillar incised with electrocautery. Muscle was dissected from the underlying muscle bed using blunt dissection and electrocautery. This was densely adherent here and had to be slowly removed there is a lot of small blood vessels that were attached. His entire tonsil was removed bleeding was controlled with electrocautery. The right tonsil was removed in a similar fashion but was not quite as adherent to the but it was still fairly scarred down it with evidence of previous abscess. He was controlled on both sides and there is no further bleeding noted. The stomach was suctioned with a soft suction but there is nothing in the stomach  The patient tolerated the procedure well  Dispo:   She was awakened taken to the recovery room in satisfactory condition. She is to be discharged home and follow up the next 2-3 weeks.  Plan:  She will Will push liquids and slowly increase her diet as tolerated. We will use liquid Tylenol with codeine for pain and supplemented with ibuprofen.,  Sonya Cole  12/30/2016 9:22 AM

## 2016-12-30 NOTE — H&P (Signed)
H&P has been reviewed and the patient reevaluated, and no changes necessary. To be downloaded later.  

## 2016-12-30 NOTE — Anesthesia Postprocedure Evaluation (Signed)
Anesthesia Post Note  Patient: Sonya Cole  Procedure(s) Performed: Procedure(s) (LRB): TONSILLECTOMY (N/A)  Patient location during evaluation: PACU Anesthesia Type: General Level of consciousness: awake and alert Pain management: pain level controlled Vital Signs Assessment: post-procedure vital signs reviewed and stable Respiratory status: spontaneous breathing, nonlabored ventilation, respiratory function stable and patient connected to nasal cannula oxygen Cardiovascular status: blood pressure returned to baseline and stable Postop Assessment: no signs of nausea or vomiting Anesthetic complications: no    Scarlette Slice

## 2016-12-30 NOTE — Transfer of Care (Signed)
Immediate Anesthesia Transfer of Care Note  Patient: Sonya Cole  Procedure(s) Performed: Procedure(s): TONSILLECTOMY (N/A)  Patient Location: PACU  Anesthesia Type: General ETT  Level of Consciousness: awake, alert  and patient cooperative  Airway and Oxygen Therapy: Patient Spontanous Breathing and Patient connected to supplemental oxygen  Post-op Assessment: Post-op Vital signs reviewed, Patient's Cardiovascular Status Stable, Respiratory Function Stable, Patent Airway and No signs of Nausea or vomiting  Post-op Vital Signs: Reviewed and stable  Complications: No apparent anesthesia complications

## 2016-12-30 NOTE — Anesthesia Preprocedure Evaluation (Signed)
Anesthesia Evaluation  Patient identified by MRN, date of birth, ID band Patient awake    Reviewed: Allergy & Precautions, H&P , NPO status , Patient's Chart, lab work & pertinent test results, reviewed documented beta blocker date and time   Airway Mallampati: II  TM Distance: >3 FB Neck ROM: full    Dental no notable dental hx.    Pulmonary Current Smoker,    Pulmonary exam normal breath sounds clear to auscultation       Cardiovascular Exercise Tolerance: Good negative cardio ROS   Rhythm:regular Rate:Normal     Neuro/Psych negative neurological ROS  negative psych ROS   GI/Hepatic negative GI ROS, Neg liver ROS,   Endo/Other  negative endocrine ROS  Renal/GU negative Renal ROS  negative genitourinary   Musculoskeletal   Abdominal   Peds  Hematology negative hematology ROS (+)   Anesthesia Other Findings   Reproductive/Obstetrics negative OB ROS                             Anesthesia Physical Anesthesia Plan  ASA: II  Anesthesia Plan: General ETT   Post-op Pain Management:    Induction:   Airway Management Planned:   Additional Equipment:   Intra-op Plan:   Post-operative Plan:   Informed Consent: I have reviewed the patients History and Physical, chart, labs and discussed the procedure including the risks, benefits and alternatives for the proposed anesthesia with the patient or authorized representative who has indicated his/her understanding and acceptance.   Dental Advisory Given  Plan Discussed with: CRNA  Anesthesia Plan Comments:         Anesthesia Quick Evaluation

## 2016-12-31 ENCOUNTER — Encounter: Payer: Self-pay | Admitting: Otolaryngology

## 2017-01-03 LAB — SURGICAL PATHOLOGY

## 2017-01-04 ENCOUNTER — Encounter: Payer: Self-pay | Admitting: Otolaryngology

## 2017-11-27 IMAGING — US US OB COMP LESS 14 WK
1 series · 14 of 28 positions shown · non-contrast
Comparison: None.

CLINICAL DATA: Six-seven weeks pregnant. Vaginal bleeding.
is 6 weeks 5 days. EDC by LMP is 04/24/2017.

EXAM:
OBSTETRIC <14 WK US AND TRANSVAGINAL OB US
TECHNIQUE: Both transabdominal and transvaginal ultrasound examinations were
performed for complete evaluation of the gestation as well as the
maternal uterus, adnexal regions, and pelvic cul-de-sac.
Transvaginal technique was performed to assess early pregnancy.

[Series 1: us ob comp less 14 wk · 0.23mm/px · 14 of 116 slices shown]
[im 5/116]
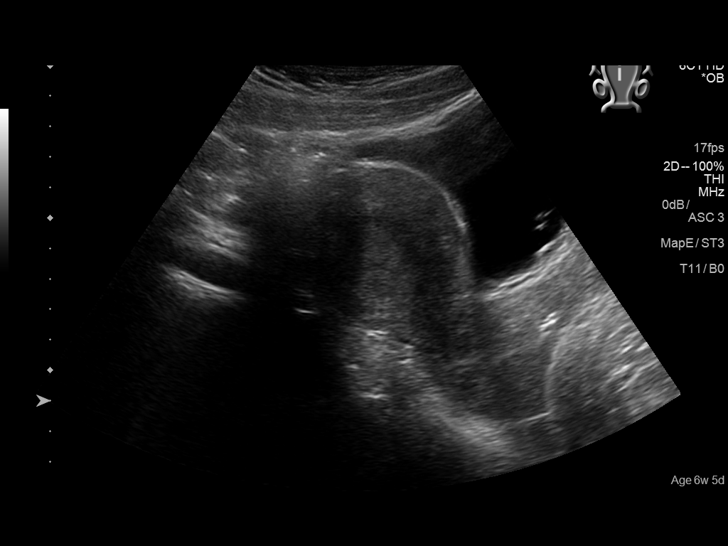
[im 13/116]
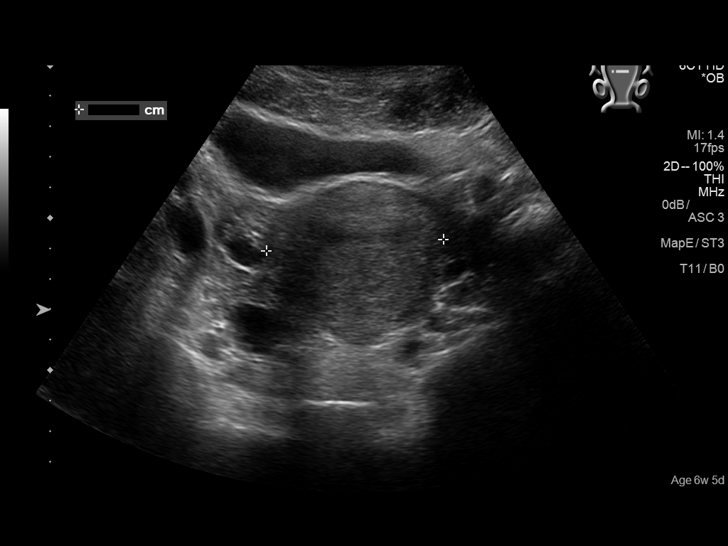
[im 22/116]
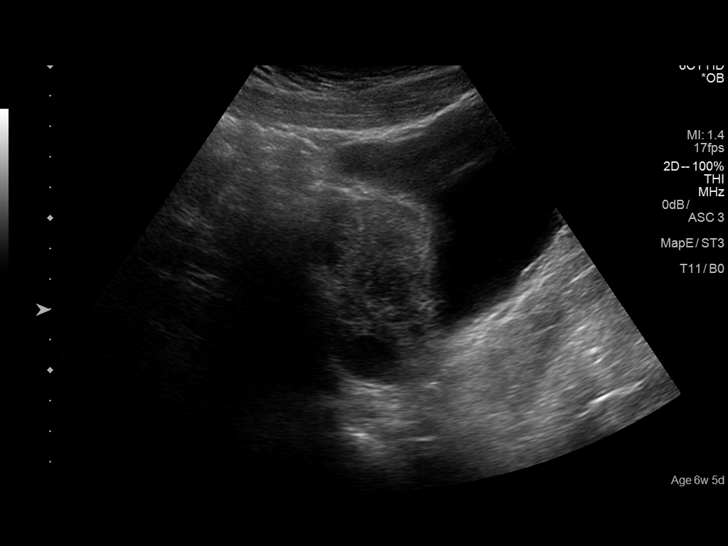
[im 30/116]
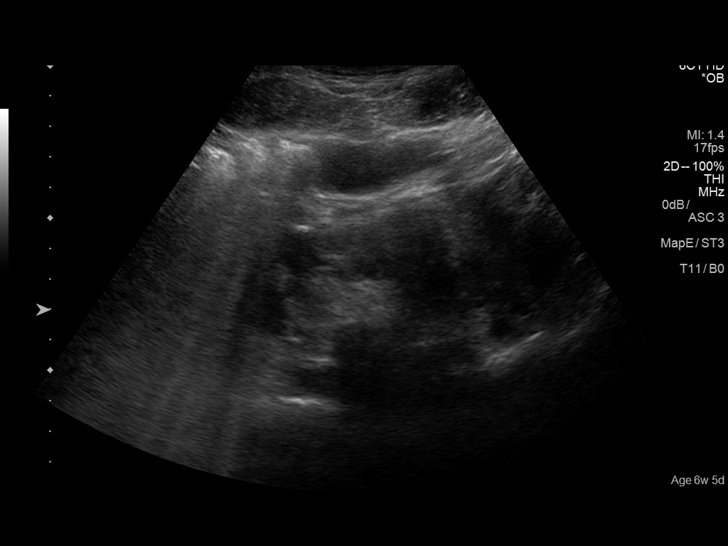
[im 39/116]
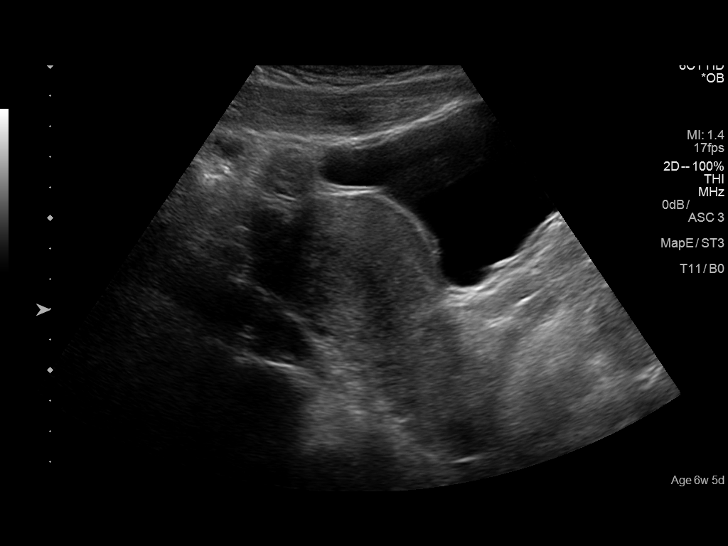
[im 47/116]
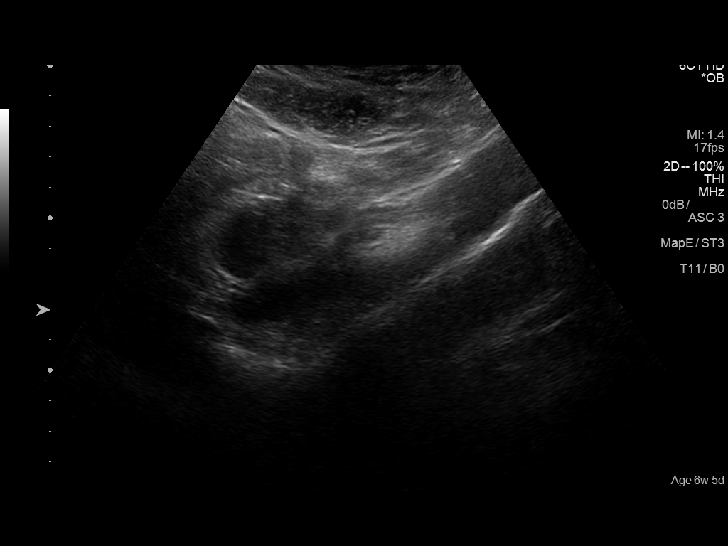
[im 56/116]
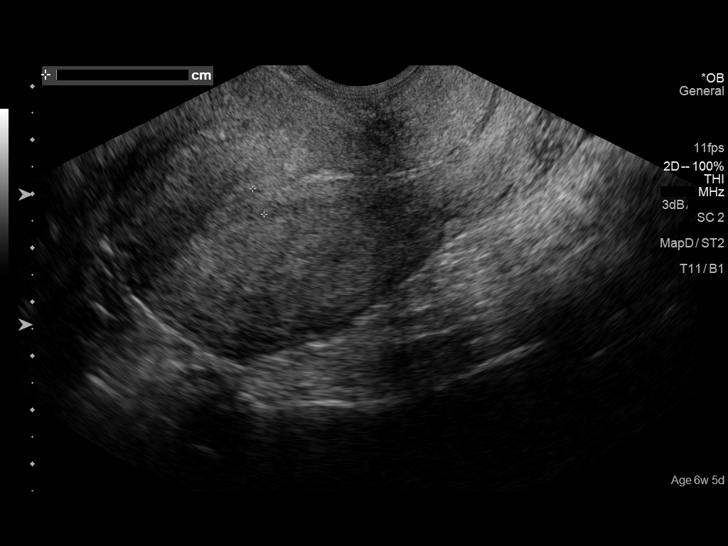
[im 64/116]
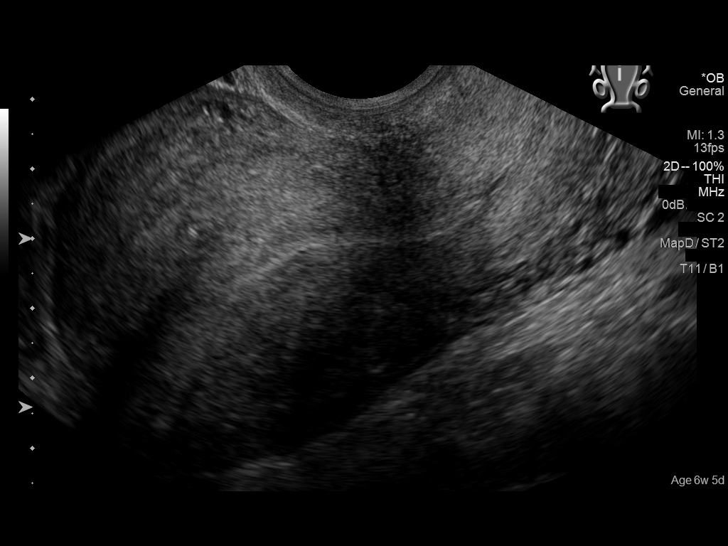
[im 73/116]
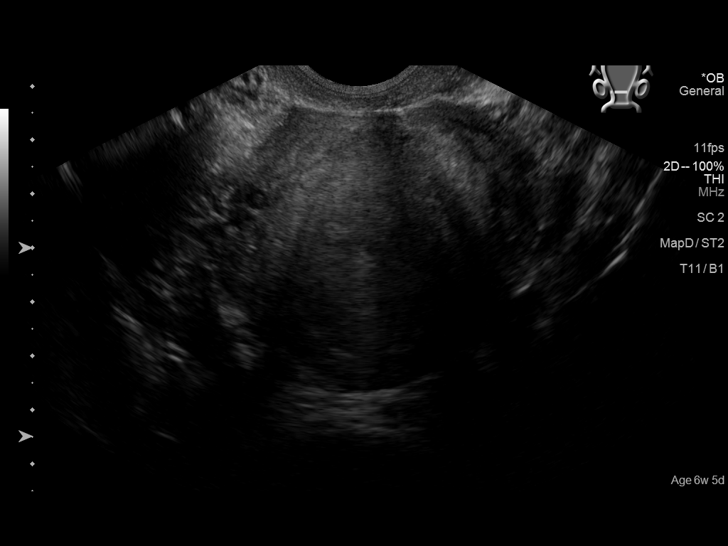
[im 81/116]
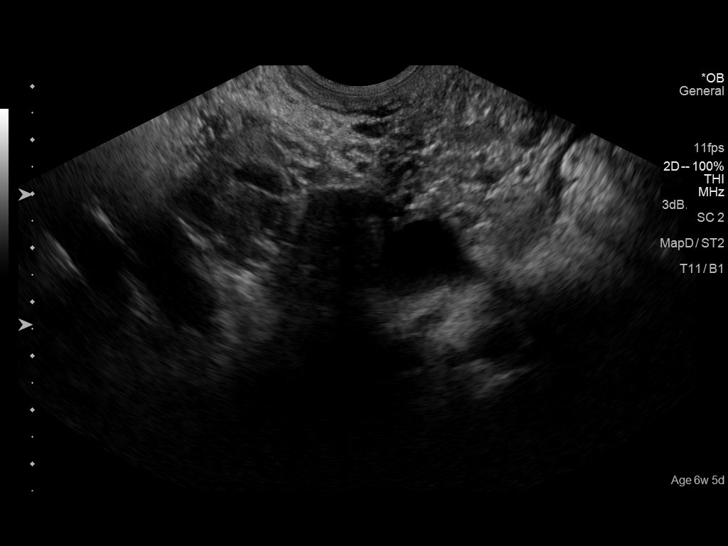
[im 90/116]
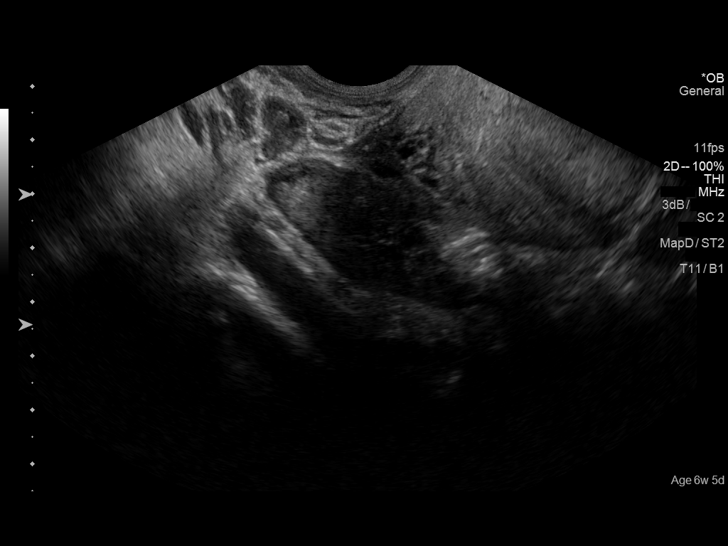
[im 98/116]
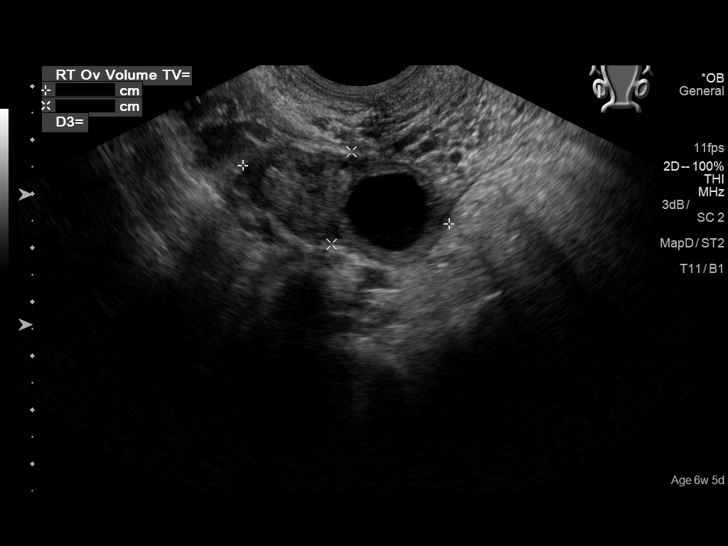
[im 107/116]
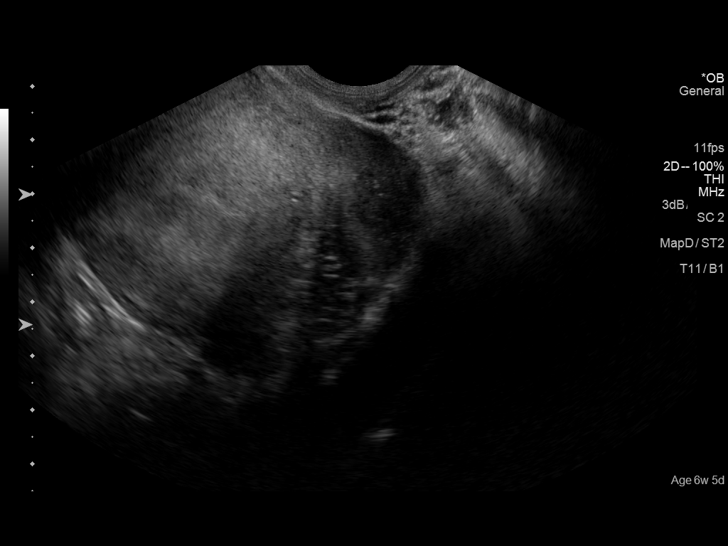
[im 116/116]
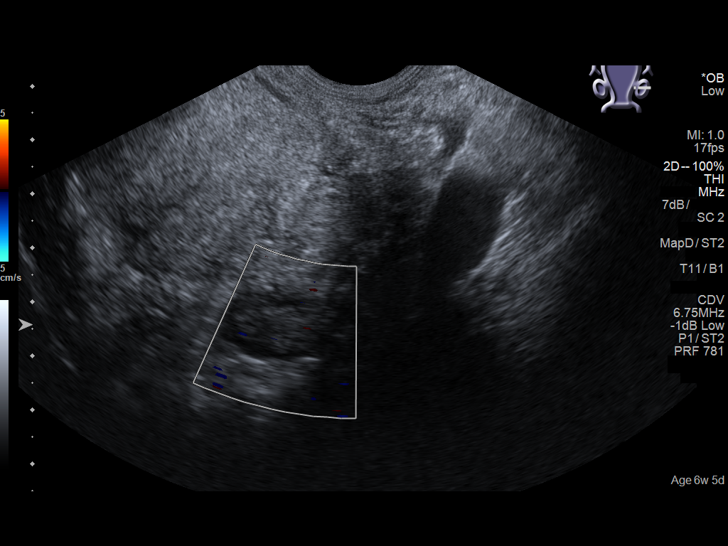

[14 of 28 positions shown; findings below may reference images not displayed]

FINDINGS: Intrauterine gestational sac: None

Yolk sac:  None

Embryo:  None

Cardiac Activity: None

Heart Rate: Absent  bpm

Subchorionic hemorrhage:  None visualized.

Maternal uterus/adnexae: The ovaries have a normal appearance. Right
corpus luteum cyst is present. Small amount of free pelvic fluid is
present.
IMPRESSION: 1.  Pregnancy of unknown location.
2. Considerations include completed spontaneous abortion, early
intrauterine pregnancy or early ectopic pregnancy.
3. Serial quantitative beta HCG values and follow-up ultrasound are
recommended as appropriate to document progression of and location
of pregnancy. Ectopic pregnancy has not been excluded.

## 2018-12-23 IMAGING — CT CT NECK W/ CM
4 of 5 series · 15 of 33 positions shown, 17 images · IV contrast (iopamidol)
Comparison: None.

CLINICAL DATA: Peritonsillar swelling. Diagnosed with strep throat
last week.

EXAM:
CT NECK WITH CONTRAST
TECHNIQUE: Multidetector CT imaging of the neck was performed using the
standard protocol following the bolus administration of intravenous
contrast.
CONTRAST:  75mL [id number]-YLL IOPAMIDOL (6KVA7M-YLL) INJECTION 61%

[Series 2: axial neck · axial · 0.56mm/px · z∈[-231,-123]mm · 3 of 110 slices shown]
[im 28/110  bone]
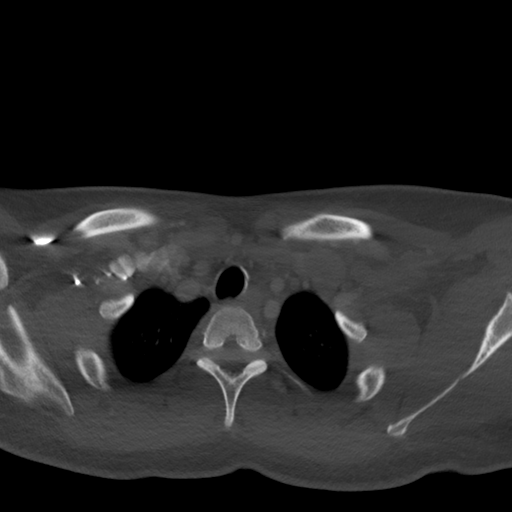
[im 55/110  bone]
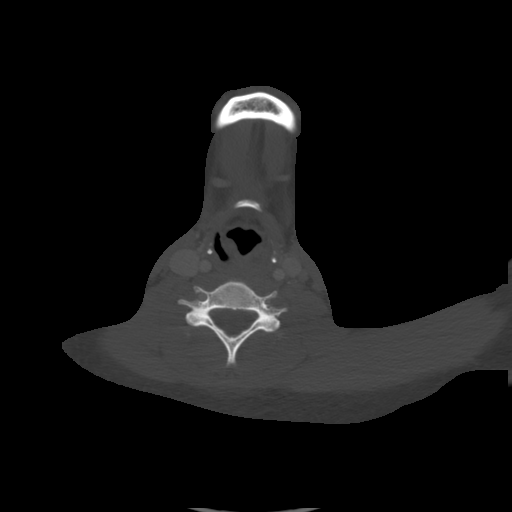
[im 82/110  bone]
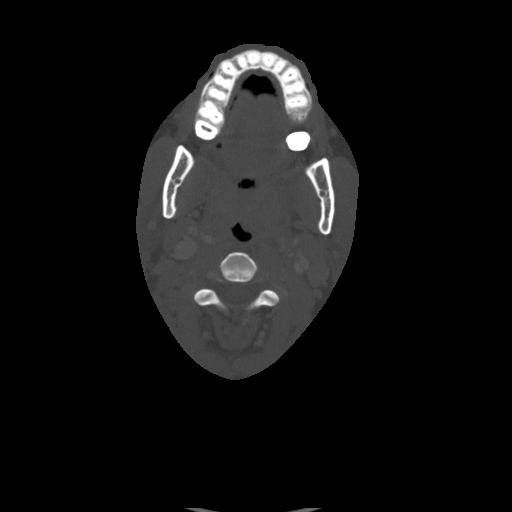

[Series 6: sag neck · sagittal · 0.46mm/px · 5 of 75 slices shown, 6 images]
[im 25/75  bone]
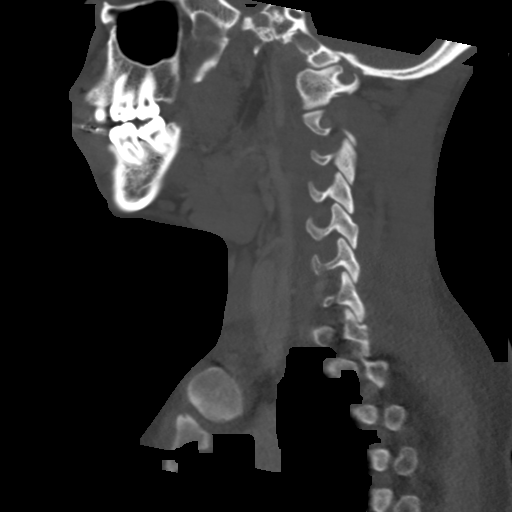
[im 31/75  bone]
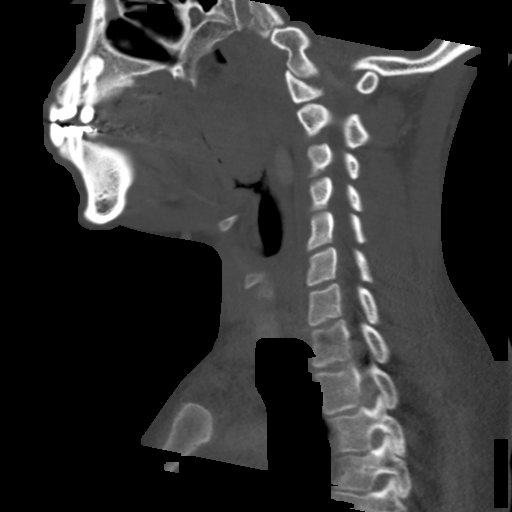
[im 38/75  soft-tissue]
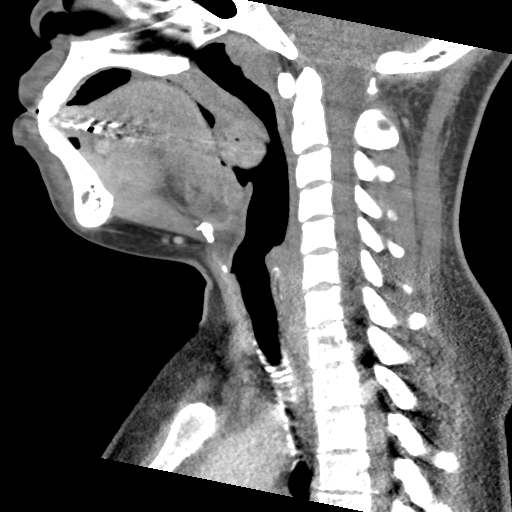
[im 38/75  bone]
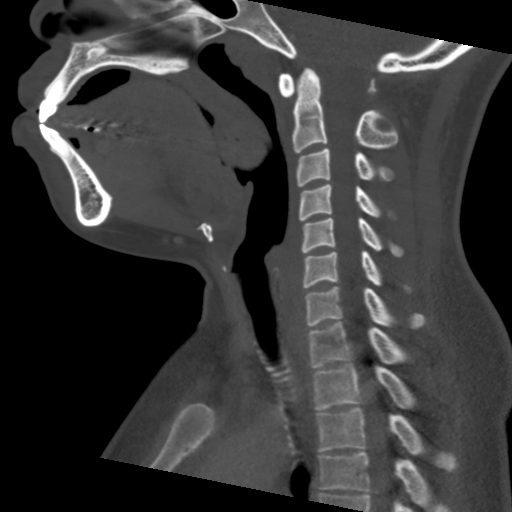
[im 44/75  bone]
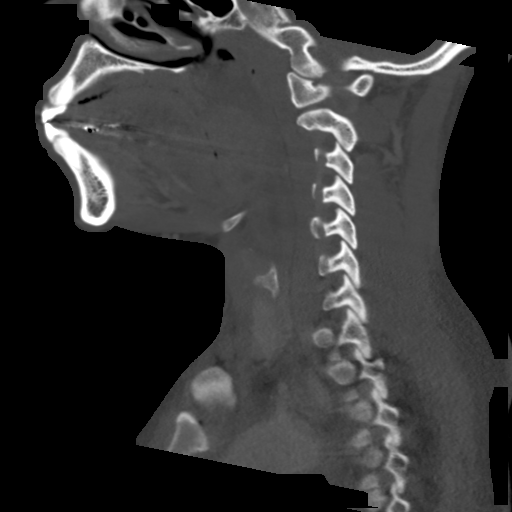
[im 50/75  bone]
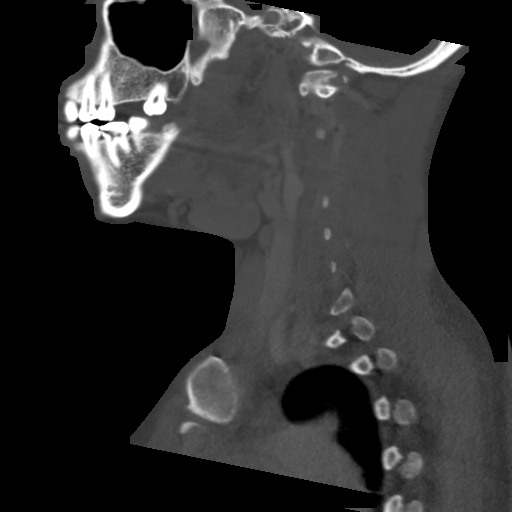

[Series 7: cor neck · coronal · 0.34mm/px · 3 of 120 slices shown]
[im 24/120  bone]
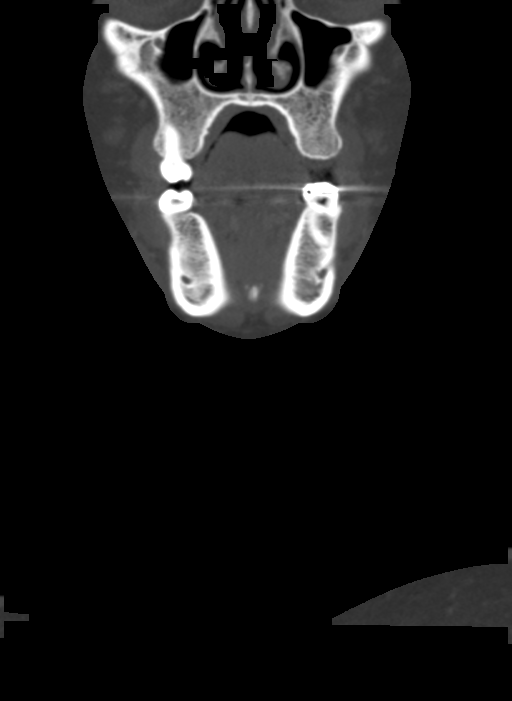
[im 48/120  bone]
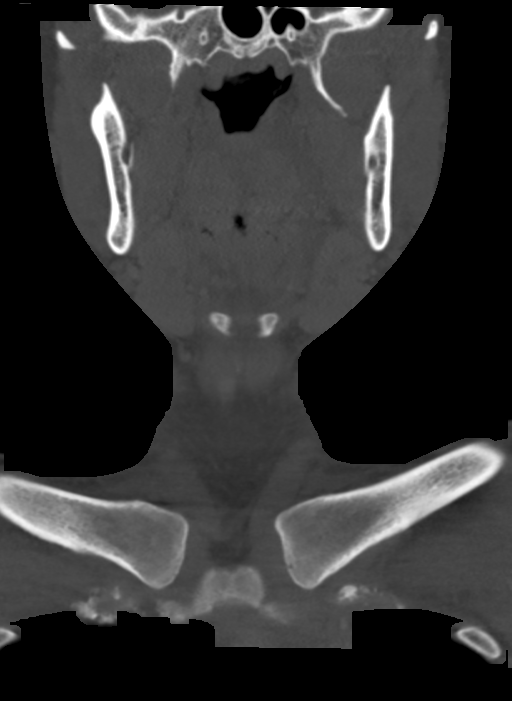
[im 72/120  bone]
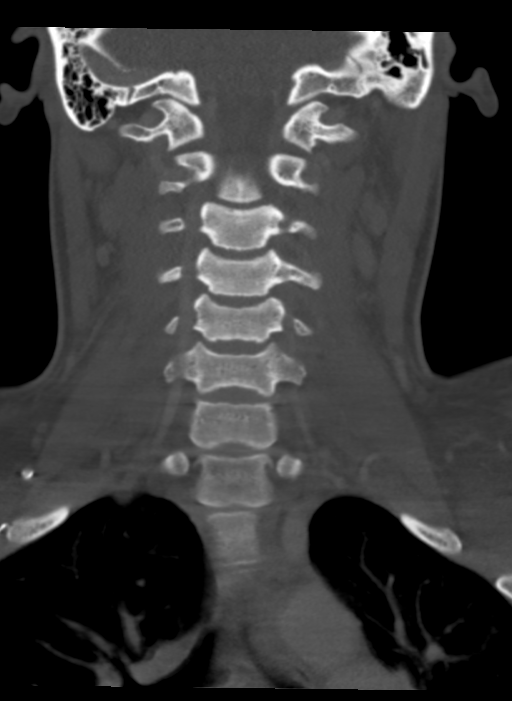

[Series 8: orthogonal ax · axial · 0.39mm/px · z∈[-263,-131]mm · 4 of 113 slices shown, 5 images]
[im 23/113  soft-tissue]
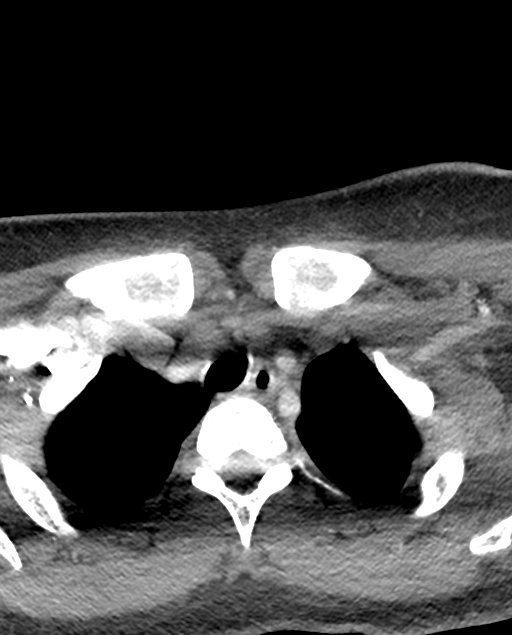
[im 23/113  bone]
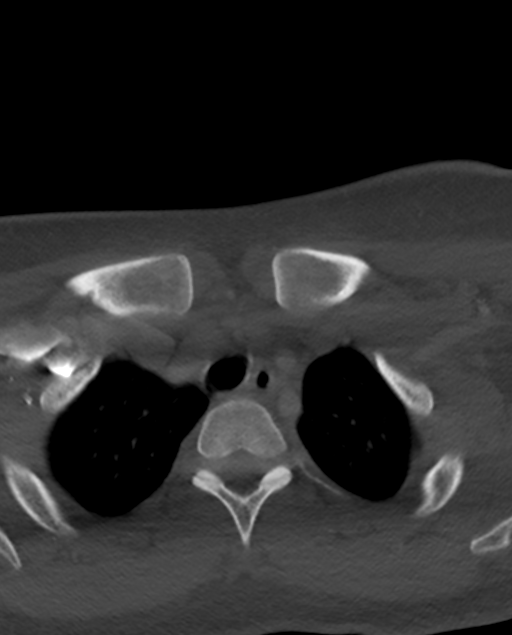
[im 45/113  bone]
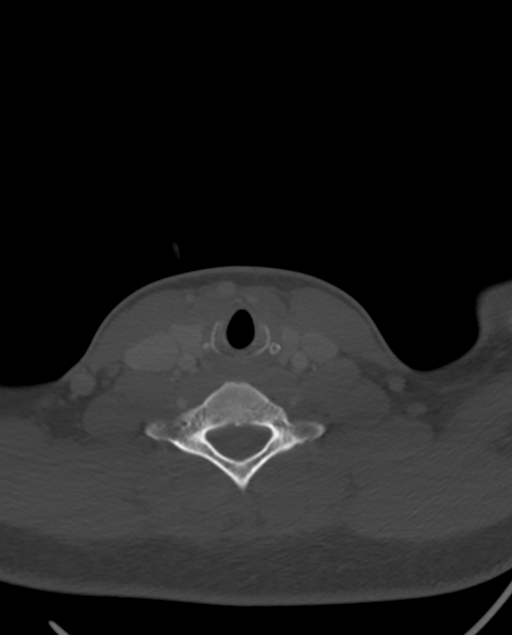
[im 68/113  bone]
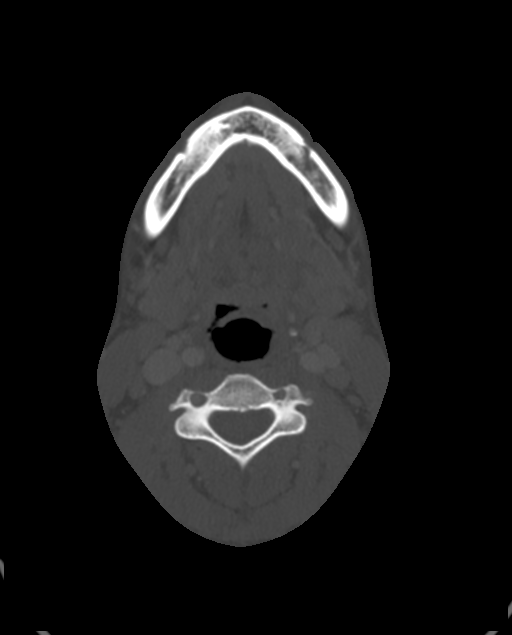
[im 90/113  bone]
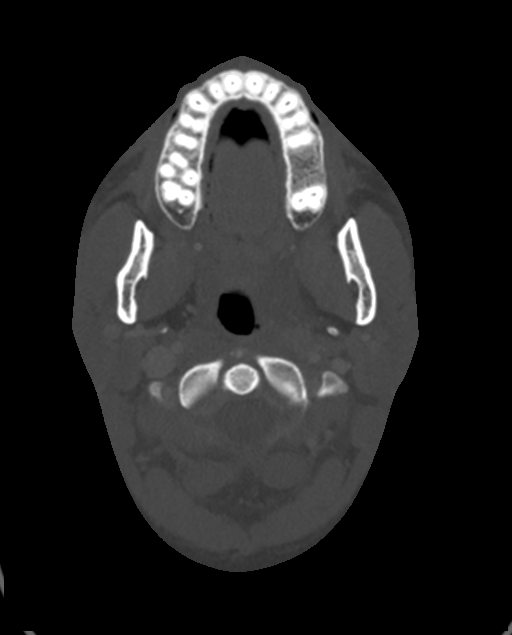

[15 of 33 positions shown; findings below may reference images not displayed]

FINDINGS: Pharynx and larynx: Inflammatory thickening of the tonsils,
especially the faucial tonsils which touch in the midline. 2 left
and 1 right peritonsillar abscess. The larger and more superior left
abscess measures 11 mm. The right-sided abscess measures 8 mm. On
the left more than right is pharyngeal edema. No epiglottis
thickening or airway stenosis.

Salivary glands: Negative

Thyroid: Negative

Lymph nodes: Reactive enlargement of cervical lymph nodes without
cavitation. Most notable enlargement of the bilateral
jugulodigastric and left lateral retropharyngeal nodes.

Vascular: No venous thrombosis.

Limited intracranial: Negative

Visualized orbits: Negative

Mastoids and visualized paranasal sinuses: Clear

Skeleton: No acute or aggressive finding.

Upper chest: No mediastinitis.
IMPRESSION: 1. Tonsillitis with 2 left and 1 right peritonsillar abscess. The
largest abscess is left-sided and measures 11 mm.
2. Pharyngitis with left predominant pharyngeal edema. No epiglottic
thickening or laryngeal stenosis.
3. Cervical adenitis.
